# Patient Record
Sex: Female | Born: 1985 | Race: Black or African American | Hispanic: No | Marital: Single | Smoking: Never smoker
Health system: Southern US, Community
[De-identification: ages and names within clinical notes are randomized; demographics above are authoritative.]

## PROBLEM LIST (undated history)

## (undated) DIAGNOSIS — M419 Scoliosis, unspecified: Secondary | ICD-10-CM

---
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
## 2006-05-17 ENCOUNTER — Emergency Department (HOSPITAL_COMMUNITY): Admission: EM | Admit: 2006-05-17 | Discharge: 2006-05-17 | Payer: Self-pay | Admitting: Emergency Medicine

## 2008-08-03 ENCOUNTER — Emergency Department (HOSPITAL_COMMUNITY): Admission: EM | Admit: 2008-08-03 | Discharge: 2008-08-03 | Payer: Self-pay | Admitting: Emergency Medicine

## 2010-05-16 LAB — POCT URINALYSIS DIP (DEVICE)
Nitrite: NEGATIVE
Urobilinogen, UA: 1 mg/dL (ref 0.0–1.0)

## 2010-05-16 LAB — POCT PREGNANCY, URINE: Preg Test, Ur: NEGATIVE

## 2011-02-02 ENCOUNTER — Encounter (HOSPITAL_COMMUNITY): Payer: Self-pay | Admitting: *Deleted

## 2011-02-02 ENCOUNTER — Inpatient Hospital Stay (HOSPITAL_COMMUNITY)
Admission: AD | Admit: 2011-02-02 | Discharge: 2011-02-02 | Disposition: A | Discharge status: Home / Self Care | Payer: Self-pay | Source: Ambulatory Visit | Attending: Obstetrics & Gynecology | Admitting: Obstetrics & Gynecology

## 2011-02-02 DIAGNOSIS — Z0000 Encounter for general adult medical examination without abnormal findings: Secondary | ICD-10-CM

## 2011-02-02 DIAGNOSIS — N949 Unspecified condition associated with female genital organs and menstrual cycle: Secondary | ICD-10-CM | POA: Insufficient documentation

## 2011-02-02 HISTORY — DX: Scoliosis, unspecified: M41.9

## 2011-02-02 LAB — URINE MICROSCOPIC-ADD ON

## 2011-02-02 LAB — WET PREP, GENITAL
Clue Cells Wet Prep HPF POC: NONE SEEN
Trich, Wet Prep: NONE SEEN
Yeast Wet Prep HPF POC: NONE SEEN

## 2011-02-02 LAB — URINALYSIS, ROUTINE W REFLEX MICROSCOPIC
Bilirubin Urine: NEGATIVE
Nitrite: NEGATIVE
Specific Gravity, Urine: >1.03 — ABNORMAL HIGH (ref 1.005–1.030)
Urobilinogen, UA: 0.2 mg/dL (ref 0.0–1.0)
pH: 6 (ref 5.0–8.0)

## 2011-02-02 NOTE — Progress Notes (Signed)
Patient states that she has a lot of pain each month when her period comes. Her last period was on 12-10. States she is not having any pain, bleeding or unusual discharge at this time.

## 2011-02-02 NOTE — Progress Notes (Signed)
Pt states sometime she has abdominal pain,"in her bladder"Pt states some time she has pain the first couple of days during her period"

## 2011-02-02 NOTE — ED Provider Notes (Signed)
History     Chief Complaint  Patient presents with  . Pelvic Pain   HPI  Pt here with report of lower pelvic pain, intermittent, x 3 months.  No pain reported at this time.  Reports similar sensation proceeding a UTI; denies abnormal vaginal discharge or bleeding.  LMP 01/17/11.    Past Medical History  Diagnosis Date  . Scoliosis     Past Surgical History  Procedure Date  . Back surgery 2004    scoliosis    Family History  Problem Relation Age of Onset  . Kidney disease Mother   . Stroke Father   . Hypertension Father     History  Substance Use Topics  . Smoking status: Not on file  . Smokeless tobacco: Not on file  . Alcohol Use: Not on file    Allergies: No Known Allergies  Prescriptions prior to admission  Medication Sig Dispense Refill  . acetaminophen (TYLENOL) 500 MG tablet Take 500 mg by mouth every 6 (six) hours as needed. cramps       . ibuprofen (ADVIL,MOTRIN) 200 MG tablet Take 200 mg by mouth every 6 (six) hours as needed. cramps         ROS Physical Exam   Blood pressure 114/71, pulse 97, temperature 99.1 F (37.3 C), temperature source Oral, resp. rate 16, height 5\' 5"  (1.651 m), weight 55.52 kg (122 lb 6.4 oz), last menstrual period 01/16/2011, SpO2 99.00%.  Physical Exam  Constitutional: She is oriented to person, place, and time. She appears well-developed and well-nourished. No distress.  HENT:  Head: Normocephalic.  Neck: Normal range of motion. Neck supple.  Cardiovascular: Normal rate, regular rhythm and normal heart sounds.  Exam reveals no gallop and no friction rub.   No murmur heard. Respiratory: Effort normal and breath sounds normal. No respiratory distress.  GI: Soft. There is no tenderness. There is no CVA tenderness.  Genitourinary: Cervix exhibits no motion tenderness and no discharge. No bleeding around the vagina. Vaginal discharge: white dc, no odor.  Musculoskeletal: Normal range of motion.  Neurological: She is alert  and oriented to person, place, and time.  Skin: Skin is warm and dry.  Psychiatric: She has a normal mood and affect.    MAU Course  Procedures  Results for orders placed during the hospital encounter of 02/02/11 (from the past 24 hour(s))  WET PREP, GENITAL     Status: Abnormal   Collection Time   02/02/11  7:00 PM      Component Value Range   Yeast, Wet Prep NONE SEEN  NONE SEEN    Trich, Wet Prep NONE SEEN  NONE SEEN    Clue Cells, Wet Prep NONE SEEN  NONE SEEN    WBC, Wet Prep HPF POC MODERATE (*) NONE SEEN   POCT PREGNANCY, URINE     Status: Normal   Collection Time   02/02/11  7:16 PM      Component Value Range   Preg Test, Ur NEGATIVE     UA - negative  Assessment and Plan  Normal Examination  Plan: DC to home FU prn  [facility] 02/02/2011, 6:33 PM

## 2011-02-04 LAB — GC/CHLAMYDIA PROBE AMP, GENITAL: Chlamydia, DNA Probe: NEGATIVE

## 2011-02-06 NOTE — ED Provider Notes (Signed)
Agree with above note.  Donna Ward 02/06/2011 1:20 PM

## 2011-05-10 ENCOUNTER — Emergency Department (HOSPITAL_COMMUNITY)
Admission: EM | Admit: 2011-05-10 | Discharge: 2011-05-10 | Disposition: A | Discharge status: Home / Self Care | Payer: Self-pay

## 2011-06-01 ENCOUNTER — Encounter (HOSPITAL_COMMUNITY): Payer: Self-pay

## 2011-06-01 ENCOUNTER — Emergency Department (INDEPENDENT_AMBULATORY_CARE_PROVIDER_SITE_OTHER)
Admission: EM | Admit: 2011-06-01 | Discharge: 2011-06-01 | Disposition: A | Discharge status: Home / Self Care | Payer: Self-pay | Source: Home / Self Care | Attending: Emergency Medicine | Admitting: Emergency Medicine

## 2011-06-01 DIAGNOSIS — N390 Urinary tract infection, site not specified: Secondary | ICD-10-CM

## 2011-06-01 LAB — POCT URINALYSIS DIP (DEVICE)
Glucose, UA: NEGATIVE mg/dL
Ketones, ur: >=160 mg/dL — AB
Leukocytes, UA: NEGATIVE
Nitrite: NEGATIVE
pH: 6.5 (ref 5.0–8.0)

## 2011-06-01 MED ORDER — CEPHALEXIN 500 MG PO CAPS: 500.0000 mg | ORAL_CAPSULE | Freq: Four times a day (QID) | ORAL | Status: AC

## 2011-06-01 NOTE — ED Notes (Signed)
C/o suprapubic pressure/pain and urgency for 1 week. States sx much worse since yesterday.

## 2011-06-01 NOTE — Discharge Instructions (Signed)
Urinary Tract Infection A urinary tract infection (UTI) is often caused by a germ (bacteria). A UTI is usually helped with medicine (antibiotics) that kills germs. Take all the medicine until it is gone. Do this even if you are feeling better. You are usually better in 7 to 10 days. HOME CARE   Drink enough water and fluids to keep your pee (urine) clear or pale yellow. Drink:   Cranberry juice.   Water.   Avoid:   Caffeine.   Tea.   Bubbly (carbonated) drinks.   Alcohol.   Only take medicine as told by your doctor.   To prevent further infections:   Pee often.   After pooping (bowel movement), women should wipe from front to back. Use each tissue only once.   Pee before and after having sex (intercourse).  Ask your doctor when your test results will be ready. Make sure you follow up and get your test results.  GET HELP RIGHT AWAY IF:   There is very bad back pain or lower belly (abdominal) pain.   You get the chills.   You have a fever.   Your baby is older than 3 months with a rectal temperature of 102 F (38.9 C) or higher.   Your baby is 3 months old or younger with a rectal temperature of 100.4 F (38 C) or higher.   You feel sick to your stomach (nauseous) or throw up (vomit).   There is continued burning with peeing.   Your problems are not better in 3 days. Return sooner if you are getting worse.  MAKE SURE YOU:   Understand these instructions.   Will watch your condition.   Will get help right away if you are not doing well or get worse.  Document Released: 07/12/2007 Document Revised: 01/12/2011 Document Reviewed: 07/12/2007 [employer] Patient Information 2012 [employer]. 

## 2011-06-01 NOTE — ED Provider Notes (Signed)
History     CSN: [id number]  Arrival date & time 06/01/11  1020   First MD Initiated Contact with Patient 06/01/11 1048      Chief Complaint  Patient presents with  . Urinary Tract Infection    (Consider location/radiation/quality/duration/timing/severity/associated sxs/prior treatment) HPI Comments: Patient presents urgent care complaining of suprapubic pain, with urinary pressure and sense of urgency for about a week. Since yesterday felt that has been gotten progressively worse. No vomiting, no fevers no flank pain. No spontaneous abdominal pain no vaginal bleeding or discharge  Patient is a 26 y.o. female presenting with urinary tract infection. The history is provided by the patient. No language interpreter was used.  Urinary Tract Infection This is a new problem. The problem occurs constantly. The problem has not changed since onset.Pertinent negatives include no chest pain, no abdominal pain, no headaches and no shortness of breath. The symptoms are aggravated by nothing. The symptoms are relieved by nothing. She has tried nothing for the symptoms. The treatment provided no relief.    Past Medical History  Diagnosis Date  . Scoliosis     Past Surgical History  Procedure Date  . Back surgery 2004    scoliosis    Family History  Problem Relation Age of Onset  . Kidney disease Mother   . Stroke Father   . Hypertension Father     History  Substance Use Topics  . Smoking status: Never Smoker   . Smokeless tobacco: Not on file  . Alcohol Use: No    OB History    Grav Para Term Preterm Abortions TAB SAB Ect Mult Living   0               Review of Systems  Constitutional: Negative for fever, chills and activity change.  Respiratory: Negative for shortness of breath.   Cardiovascular: Negative for chest pain, palpitations and leg swelling.  Gastrointestinal: Negative for abdominal pain.  Genitourinary: Positive for dysuria and frequency. Negative for urgency,  flank pain, decreased urine volume, vaginal bleeding, vaginal discharge, enuresis, difficulty urinating and pelvic pain.  Neurological: Negative for headaches.    Allergies  Review of patient's allergies indicates no known allergies.  Home Medications   Current Outpatient Rx  Name Route Sig Dispense Refill  . CEPHALEXIN 500 MG PO CAPS Oral Take 1 capsule (500 mg total) by mouth 4 (four) times daily. 28 capsule 0  . NAPROXEN SODIUM 220 MG PO TABS Oral Take 440 mg by mouth 2 (two) times daily with a meal.      BP 125/88  Pulse 79  Temp(Src) 98.2 F (36.8 C) (Oral)  Resp 16  SpO2 99%  LMP 05/26/2011  Physical Exam  Nursing note and vitals reviewed. Constitutional: She appears well-developed and well-nourished.  Eyes: Conjunctivae are normal.  Pulmonary/Chest: Effort normal.  Abdominal: Soft. She exhibits no distension and no mass. There is no tenderness. There is no rebound, no guarding and no CVA tenderness.    ED Course  Procedures (including critical care time)  Labs Reviewed  POCT URINALYSIS DIP (DEVICE) - Abnormal; Notable for the following:    Bilirubin Urine SMALL (*)    Ketones, ur >=160 (*)    Hgb urine dipstick MODERATE (*)    Protein, ur 30 (*)    Urobilinogen, UA 2.0 (*)    All other components within normal limits  POCT PREGNANCY, URINE   No results found.   1. Urinary tract infection  MDM  The patient will uncomplicated cystitis. Afebrile tolerating oral well looks comfortable.        Jimmie Molly, MD 06/01/11 1150

## 2012-07-28 ENCOUNTER — Emergency Department (HOSPITAL_COMMUNITY)
Admission: EM | Admit: 2012-07-28 | Discharge: 2012-07-28 | Disposition: A | Discharge status: Home / Self Care | Payer: Self-pay | Attending: Emergency Medicine | Admitting: Emergency Medicine

## 2012-07-28 ENCOUNTER — Emergency Department (HOSPITAL_COMMUNITY): Payer: Self-pay

## 2012-07-28 ENCOUNTER — Encounter (HOSPITAL_COMMUNITY): Payer: Self-pay | Admitting: *Deleted

## 2012-07-28 DIAGNOSIS — Z791 Long term (current) use of non-steroidal anti-inflammatories (NSAID): Secondary | ICD-10-CM | POA: Insufficient documentation

## 2012-07-28 DIAGNOSIS — J3489 Other specified disorders of nose and nasal sinuses: Secondary | ICD-10-CM | POA: Insufficient documentation

## 2012-07-28 DIAGNOSIS — J45909 Unspecified asthma, uncomplicated: Secondary | ICD-10-CM | POA: Insufficient documentation

## 2012-07-28 DIAGNOSIS — R51 Headache: Secondary | ICD-10-CM | POA: Insufficient documentation

## 2012-07-28 DIAGNOSIS — R6883 Chills (without fever): Secondary | ICD-10-CM | POA: Insufficient documentation

## 2012-07-28 DIAGNOSIS — R5381 Other malaise: Secondary | ICD-10-CM | POA: Insufficient documentation

## 2012-07-28 DIAGNOSIS — R0989 Other specified symptoms and signs involving the circulatory and respiratory systems: Secondary | ICD-10-CM

## 2012-07-28 DIAGNOSIS — Z8739 Personal history of other diseases of the musculoskeletal system and connective tissue: Secondary | ICD-10-CM | POA: Insufficient documentation

## 2012-07-28 DIAGNOSIS — J40 Bronchitis, not specified as acute or chronic: Secondary | ICD-10-CM

## 2012-07-28 MED ORDER — ALBUTEROL SULFATE HFA 108 (90 BASE) MCG/ACT IN AERS
2.0000 | INHALATION_SPRAY | Freq: Once | RESPIRATORY_TRACT | Status: AC
  Administered 2012-07-28: 2 via RESPIRATORY_TRACT
  Filled 2012-07-28: qty 6.7

## 2012-07-28 MED ORDER — IPRATROPIUM BROMIDE 0.02 % IN SOLN
0.5000 mg | RESPIRATORY_TRACT | Status: DC
  Administered 2012-07-28: 0.5 mg via RESPIRATORY_TRACT
  Filled 2012-07-28: qty 2.5

## 2012-07-28 MED ORDER — HYDROCODONE-HOMATROPINE 5-1.5 MG/5ML PO SYRP: 5.0000 mL | ORAL_SOLUTION | Freq: Four times a day (QID) | ORAL | Status: DC | PRN

## 2012-07-28 MED ORDER — ALBUTEROL SULFATE (5 MG/ML) 0.5% IN NEBU
2.5000 mg | INHALATION_SOLUTION | RESPIRATORY_TRACT | Status: DC
  Administered 2012-07-28: 2.5 mg via RESPIRATORY_TRACT
  Filled 2012-07-28: qty 0.5

## 2012-07-28 NOTE — ED Notes (Addendum)
Pt states for 2 weeks she has had head and chest congestion and cough that is occasionally productive of green sputum.  Pt denies N/V/D and fever.

## 2012-07-28 NOTE — ED Provider Notes (Signed)
History     CSN: [id number]  Arrival date & time 07/28/12  1207   First MD Initiated Contact with Patient 07/28/12 1236      Chief Complaint  Patient presents with  . Cough  . Nasal Congestion    (Consider location/radiation/quality/duration/timing/severity/associated sxs/prior treatment) Patient is a 27 y.o. female presenting with URI. The history is provided by the patient. No language interpreter was used.  URI Presenting symptoms: congestion, cough and fatigue   Presenting symptoms: no ear pain, no facial pain, no fever, no rhinorrhea and no sore throat   Congestion:    Location:  Nasal and chest   Interferes with sleep: yes     Interferes with eating/drinking: no   Cough:    Cough characteristics:  Productive   Sputum characteristics:  Nondescript   Severity:  Moderate   Duration:  4 days   Progression:  Worsening Associated symptoms: headaches and sinus pain   Associated symptoms: no arthralgias, no myalgias, no neck pain, no sneezing, no swollen glands and no wheezing   SUBJECTIVE:  Donna Ward is a 27 y.o. female who complains of coryza, congestion, productive cough, myalgias, headache and chills for 4 days. She denies a history of anorexia, nausea, vomiting, weakness and weight loss and denies a history of asthma. Patient denies smoke cigarettes. .     Past Medical History  Diagnosis Date  . Scoliosis     Past Surgical History  Procedure Laterality Date  . Back surgery  2004    scoliosis    Family History  Problem Relation Age of Onset  . Kidney disease Mother   . Stroke Father   . Hypertension Father     History  Substance Use Topics  . Smoking status: Never Smoker   . Smokeless tobacco: Never Used  . Alcohol Use: No    OB History   Grav Para Term Preterm Abortions TAB SAB Ect Mult Living   0               Review of Systems  Constitutional: Positive for chills and fatigue. Negative for fever.  HENT: Positive for congestion.  Negative for ear pain, sore throat, rhinorrhea, sneezing, trouble swallowing and neck pain.   Respiratory: Positive for cough. Negative for shortness of breath and wheezing.   Cardiovascular: Negative for chest pain.  Gastrointestinal: Negative for nausea, vomiting, abdominal pain, diarrhea and constipation.  Genitourinary: Negative for dysuria and hematuria.  Musculoskeletal: Negative for myalgias and arthralgias.  Skin: Negative for rash.  Neurological: Positive for headaches. Negative for numbness.  All other systems reviewed and are negative.    Allergies  Review of patient's allergies indicates no known allergies.  Home Medications   Current Outpatient Rx  Name  Route  Sig  Dispense  Refill  . naproxen sodium (ANAPROX) 220 MG tablet   Oral   Take 440 mg by mouth 2 (two) times daily with a meal.           BP 115/67  Pulse 82  Temp(Src) 98.7 F (37.1 C) (Oral)  Resp 19  SpO2 97%  LMP 07/14/2012  Physical Exam Appears moderately ill but not toxic; temperature as noted in vitals. Ears normal. Eyes:glassy appearance, no discharge  Heart: RRR, NO M/G/R Throat and pharynx normal.   Neck supple. No adenopathyhy in the neck.  Sinuses non tender.  There is wheezing throughout the chest lung fields. No increased effort. Abdomen is soft and nontende  ED Course  Procedures (including critical care  time)  Labs Reviewed - No data to display No results found.   1. Bronchitis   2. Reactive airway disease that is not asthma       MDM  Given duoneb with sig relief. Pt CXR negative for acute infiltrate. Patients symptoms are consistent with URI, likely viral etiology. Discussed that antibiotics are not indicated for viral infections. Pt will be discharged with symptomatic treatment.  Verbalizes understanding and is agreeable with plan. Pt is hemodynamically stable & in NAD prior to dc. D/c with albuterol and hycodan.       Arthor Captain, PA-C 07/28/12  [address] PA-C 07/28/12 1541

## 2012-07-28 NOTE — ED Provider Notes (Signed)
Medical screening examination/treatment/procedure(s) were performed by non-physician practitioner and as supervising physician I was immediately available for consultation/collaboration.   Nelia Shi, MD 07/28/12 [phone]

## 2012-12-27 ENCOUNTER — Encounter (HOSPITAL_COMMUNITY): Payer: Self-pay | Admitting: Emergency Medicine

## 2012-12-27 ENCOUNTER — Emergency Department (HOSPITAL_COMMUNITY)
Admission: EM | Admit: 2012-12-27 | Discharge: 2012-12-27 | Disposition: A | Discharge status: Home / Self Care | Payer: Self-pay | Attending: Emergency Medicine | Admitting: Emergency Medicine

## 2012-12-27 ENCOUNTER — Emergency Department (HOSPITAL_COMMUNITY): Payer: Self-pay

## 2012-12-27 DIAGNOSIS — IMO0002 Reserved for concepts with insufficient information to code with codable children: Secondary | ICD-10-CM | POA: Insufficient documentation

## 2012-12-27 DIAGNOSIS — Y9389 Activity, other specified: Secondary | ICD-10-CM | POA: Insufficient documentation

## 2012-12-27 DIAGNOSIS — M549 Dorsalgia, unspecified: Secondary | ICD-10-CM

## 2012-12-27 DIAGNOSIS — Z8739 Personal history of other diseases of the musculoskeletal system and connective tissue: Secondary | ICD-10-CM | POA: Insufficient documentation

## 2012-12-27 DIAGNOSIS — Y92410 Unspecified street and highway as the place of occurrence of the external cause: Secondary | ICD-10-CM | POA: Insufficient documentation

## 2012-12-27 MED ORDER — HYDROCODONE-ACETAMINOPHEN 5-325 MG PO TABS: 1.0000 | ORAL_TABLET | Freq: Four times a day (QID) | ORAL | Status: AC | PRN

## 2012-12-27 MED ORDER — IBUPROFEN 800 MG PO TABS
800.0000 mg | ORAL_TABLET | Freq: Once | ORAL | Status: AC
  Administered 2012-12-27: 800 mg via ORAL
  Filled 2012-12-27: qty 1
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
## 2012-12-27 NOTE — ED Notes (Signed)
PA in room

## 2012-12-27 NOTE — ED Notes (Signed)
Vital signs stable. 

## 2012-12-27 NOTE — ED Notes (Signed)
Patient transported to X-ray 
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
## 2012-12-28 NOTE — ED Provider Notes (Signed)
Medical screening examination/treatment/procedure(s) were performed by non-physician practitioner and as supervising physician I was immediately available for consultation/collaboration.  EKG Interpretation   None        Margurette Brener K Ameka Krigbaum-Rasch, MD 12/28/12 1610

## 2014-01-27 ENCOUNTER — Emergency Department (HOSPITAL_COMMUNITY): Payer: Self-pay

## 2014-01-27 ENCOUNTER — Encounter (HOSPITAL_COMMUNITY): Payer: Self-pay | Admitting: Emergency Medicine

## 2014-01-27 ENCOUNTER — Emergency Department (HOSPITAL_COMMUNITY)
Admission: EM | Admit: 2014-01-27 | Discharge: 2014-01-27 | Disposition: A | Discharge status: Home / Self Care | Payer: Self-pay | Attending: Emergency Medicine | Admitting: Emergency Medicine

## 2014-01-27 DIAGNOSIS — R1031 Right lower quadrant pain: Secondary | ICD-10-CM

## 2014-01-27 DIAGNOSIS — R102 Pelvic and perineal pain: Secondary | ICD-10-CM | POA: Insufficient documentation

## 2014-01-27 DIAGNOSIS — Z3202 Encounter for pregnancy test, result negative: Secondary | ICD-10-CM | POA: Insufficient documentation

## 2014-01-27 DIAGNOSIS — R1021 Pelvic and perineal pain right side: Secondary | ICD-10-CM

## 2014-01-27 DIAGNOSIS — N898 Other specified noninflammatory disorders of vagina: Secondary | ICD-10-CM | POA: Insufficient documentation

## 2014-01-27 DIAGNOSIS — R300 Dysuria: Secondary | ICD-10-CM

## 2014-01-27 DIAGNOSIS — M545 Low back pain: Secondary | ICD-10-CM | POA: Insufficient documentation

## 2014-01-27 LAB — WET PREP, GENITAL
Trich, Wet Prep: NONE SEEN
Yeast Wet Prep HPF POC: NONE SEEN

## 2014-01-27 LAB — BASIC METABOLIC PANEL
Anion gap: 6 (ref 5–15)
BUN: 10 mg/dL (ref 6–23)
CO2: 28 mmol/L (ref 19–32)
Calcium: 9 mg/dL (ref 8.4–10.5)
Chloride: 102 mEq/L (ref 96–112)
Creatinine, Ser: 0.66 mg/dL (ref 0.50–1.10)
GFR calc Af Amer: >90 mL/min (ref >90)
GFR calc non Af Amer: >90 mL/min (ref >90)
Glucose, Bld: 97 mg/dL (ref 70–99)
Potassium: 3.6 mmol/L (ref 3.5–5.1)
Sodium: 136 mmol/L (ref 135–145)

## 2014-01-27 LAB — URINALYSIS, ROUTINE W REFLEX MICROSCOPIC
Bilirubin Urine: NEGATIVE
GLUCOSE, UA: NEGATIVE mg/dL
KETONES UR: NEGATIVE mg/dL
Nitrite: NEGATIVE
PROTEIN: NEGATIVE mg/dL
Specific Gravity, Urine: 1.03 (ref 1.005–1.030)
Urobilinogen, UA: 0.2 mg/dL (ref 0.0–1.0)
pH: 6 (ref 5.0–8.0)

## 2014-01-27 LAB — CBC WITH DIFFERENTIAL/PLATELET
Basophils Absolute: 0 10*3/uL (ref 0.0–0.1)
Basophils Relative: 1 % (ref 0–1)
Eosinophils Absolute: 0.1 10*3/uL (ref 0.0–0.7)
Eosinophils Relative: 2 % (ref 0–5)
HCT: 38.2 % (ref 36.0–46.0)
Hemoglobin: 12.6 g/dL (ref 12.0–15.0)
Lymphocytes Relative: 39 % (ref 12–46)
Lymphs Abs: 2.3 10*3/uL (ref 0.7–4.0)
MCH: 30.8 pg (ref 26.0–34.0)
MCHC: 33 g/dL (ref 30.0–36.0)
MCV: 93.4 fL (ref 78.0–100.0)
Monocytes Absolute: 0.4 10*3/uL (ref 0.1–1.0)
Monocytes Relative: 7 % (ref 3–12)
Neutro Abs: 3 10*3/uL (ref 1.7–7.7)
Neutrophils Relative %: 51 % (ref 43–77)
Platelets: 241 10*3/uL (ref 150–400)
RBC: 4.09 MIL/uL (ref 3.87–5.11)
RDW: 12.7 % (ref 11.5–15.5)
WBC: 5.8 10*3/uL (ref 4.0–10.5)

## 2014-01-27 LAB — URINE MICROSCOPIC-ADD ON

## 2014-01-27 LAB — POC URINE PREG, ED: Preg Test, Ur: NEGATIVE

## 2014-01-27 MED ORDER — PHENAZOPYRIDINE HCL 200 MG PO TABS: 200.0000 mg | ORAL_TABLET | Freq: Three times a day (TID) | ORAL | Status: DC

## 2014-01-27 MED ORDER — NITROFURANTOIN MONOHYD MACRO 100 MG PO CAPS: 100.0000 mg | ORAL_CAPSULE | Freq: Two times a day (BID) | ORAL | Status: AC

## 2014-01-27 MED ORDER — TRAMADOL HCL 50 MG PO TABS: 50.0000 mg | ORAL_TABLET | Freq: Four times a day (QID) | ORAL | Status: AC | PRN

## 2014-01-27 MED ORDER — MORPHINE SULFATE 4 MG/ML IJ SOLN
4.0000 mg | Freq: Once | INTRAMUSCULAR | Status: AC
  Administered 2014-01-27: 4 mg via INTRAVENOUS
  Filled 2014-01-27: qty 1

## 2014-01-27 MED ORDER — IBUPROFEN 600 MG PO TABS: 600.0000 mg | ORAL_TABLET | Freq: Four times a day (QID) | ORAL | Status: AC | PRN

## 2014-01-27 NOTE — ED Notes (Signed)
Pt states that she has had low abd pain and dysuria since "before breakfast" this morning.  Denies other sx.

## 2014-01-27 NOTE — ED Provider Notes (Signed)
CSN: [id number]     Arrival date & time 01/27/14  5621 History   First MD Initiated Contact with Patient 01/27/14 [phone]     Chief Complaint  Patient presents with  . Dysuria  . Abdominal Pain     (Consider location/radiation/quality/duration/timing/severity/associated sxs/prior Treatment) HPI  Pt is a 28yo female presenting to ED with c/o RLQ abdominal pain that started this morning before breakfast, associated with dysuria, and lower back pain. Pain is constant, sharp and stabbing, worse with certain movements and walking, 6/10 at worst. Denies fever, chills, n/v/d. States pain is similar to cramping but she finished her menstrual cycle just last week and states it is never usually on her right side. She has had urinary infections in past. Denies vaginal symptoms. No medication taken PTA. Denies hx of abdominal surgeries. Denies hx of renal stones.  LMP: 01/22/14, states she has been having her cycle twice a month, heavy periods and irregular.  Denies concern for STD. She has not f/u with GYN. Pt is not on birth control.  Past Medical History  Diagnosis Date  . Scoliosis   . Scoliosis    Past Surgical History  Procedure Laterality Date  . Back surgery  2004    scoliosis   Family History  Problem Relation Age of Onset  . Kidney disease Mother   . Stroke Father   . Hypertension Father    History  Substance Use Topics  . Smoking status: Never Smoker   . Smokeless tobacco: Never Used  . Alcohol Use: No   OB History    Gravida Para Term Preterm AB TAB SAB Ectopic Multiple Living   0              Review of Systems  Constitutional: Negative for fever, chills and appetite change.  Respiratory: Negative for cough and shortness of breath.   Gastrointestinal: Positive for abdominal pain. Negative for nausea, vomiting, diarrhea and constipation.  Genitourinary: Positive for dysuria. Negative for urgency, frequency, hematuria, flank pain, decreased urine volume, vaginal bleeding,  vaginal discharge, vaginal pain, menstrual problem and pelvic pain.  Musculoskeletal: Positive for back pain ( lower). Negative for myalgias.  All other systems reviewed and are negative.     Allergies  Review of patient's allergies indicates no known allergies.  Home Medications   Prior to Admission medications   Medication Sig Start Date End Date Taking? Authorizing Provider  HYDROcodone-acetaminophen (NORCO/VICODIN) 5-325 MG per tablet Take 1-2 tablets by mouth every 6 (six) hours as needed for severe pain. Patient not taking: Reported on 01/27/2014 12/27/12   Victorino Dike L Piepenbrink, PA-C  ibuprofen (ADVIL,MOTRIN) 600 MG tablet Take 1 tablet (600 mg total) by mouth every 6 (six) hours as needed. 01/27/14   Junius Finner, PA-C  nitrofurantoin, macrocrystal-monohydrate, (MACROBID) 100 MG capsule Take 1 capsule (100 mg total) by mouth 2 (two) times daily. 01/27/14   Junius Finner, PA-C  phenazopyridine (PYRIDIUM) 200 MG tablet Take 1 tablet (200 mg total) by mouth 3 (three) times daily. 01/27/14   Junius Finner, PA-C  traMADol (ULTRAM) 50 MG tablet Take 1 tablet (50 mg total) by mouth every 6 (six) hours as needed. 01/27/14   Junius Finner, PA-C   BP 120/71 mmHg  Pulse 66  Temp(Src) 98.3 F (36.8 C) (Oral)  Resp 16  SpO2 100% Physical Exam  Constitutional: She appears well-developed and well-nourished. No distress.  Pt lying comfortably in exam bed, NAD.   HENT:  Head: Normocephalic and atraumatic.  Eyes: Conjunctivae are normal.  No scleral icterus.  Neck: Normal range of motion.  Cardiovascular: Normal rate, regular rhythm and normal heart sounds.   Pulmonary/Chest: Effort normal and breath sounds normal. No respiratory distress. She has no wheezes. She has no rales. She exhibits no tenderness.  Abdominal: Soft. Bowel sounds are normal. She exhibits no distension and no mass. There is tenderness. There is guarding. There is no rebound.  Soft, non-distended, tenderness in RLQ, no  rebound, guarding or masses. No CVAT  Genitourinary:  Chaperoned exam. Normal external genitalia, small amount of physiologic vaginal discharge. No CMT, Right adnexal tenderness w/o mass. No left adnexal tenderness or mass.    Musculoskeletal: Normal range of motion.  Neurological: She is alert.  Skin: Skin is warm and dry. She is not diaphoretic.  Nursing note and vitals reviewed.   ED Course  Procedures (including critical care time) Labs Review Labs Reviewed  WET PREP, GENITAL - Abnormal; Notable for the following:    Clue Cells Wet Prep HPF POC FEW (*)    WBC, Wet Prep HPF POC MANY (*)    All other components within normal limits  URINALYSIS, ROUTINE W REFLEX MICROSCOPIC - Abnormal; Notable for the following:    Color, Urine AMBER (*)    Hgb urine dipstick TRACE (*)    Leukocytes, UA TRACE (*)    All other components within normal limits  URINE MICROSCOPIC-ADD ON - Abnormal; Notable for the following:    Squamous Epithelial / LPF FEW (*)    All other components within normal limits  URINE CULTURE  CBC WITH DIFFERENTIAL  BASIC METABOLIC PANEL  POC URINE PREG, ED    Imaging Review [place] Transvaginal Non-ob  01/27/2014   CLINICAL DATA:  Right-sided pelvic pain since this morning ; urinary tract infections symptoms  EXAM: TRANSABDOMINAL AND TRANSVAGINAL ULTRASOUND OF PELVIS  DOPPLER ULTRASOUND OF OVARIES  TECHNIQUE: Both transabdominal and transvaginal ultrasound examinations of the pelvis were performed. Transabdominal technique was performed for global imaging of the pelvis including uterus, ovaries, adnexal regions, and pelvic cul-de-sac.  It was necessary to proceed with endovaginal exam following the transabdominal exam to visualize the endometrium and adnexal structures. Color and duplex Doppler ultrasound was utilized to evaluate blood flow to the ovaries.  COMPARISON:  None.  FINDINGS: Uterus  Measurements: 8.1 x 2.8 x 4.0 cm. No fibroids or other mass visualized.  Endometrium   Thickness: 7.4 mm. There is a heterogeneous focus in the endometrium in the lower uterine segment.  Right ovary  Measurements: 3.0 x 1.5 x 2.0 cm. Normal appearance/no adnexal mass.  Left ovary  Measurements: 2.5 x 1.2 x 1.7 cm. Normal appearance/no adnexal mass.  Pulsed Doppler evaluation of both ovaries demonstrates normal low-resistance arterial and venous waveforms.  Other findings  No free fluid.  IMPRESSION: 1. No adnexal mass is demonstrated. Vascularity of the ovaries is normal. 2. The uterus exhibits normal echotexture and contour. The endometrial stripe is not abnormally thickened. There is a heterogeneous focus in the inferior aspect of the endometrium that is nonspecific. Follow-up ultrasound in 4-6 weeks is recommended to reassess the endometrium.   Electronically Signed   By: David  Swazil[place]   On: 01/27/2014 13:15   [place] Pelvis Complete  01/27/2014   CLINICAL DATA:  Right pelvic pain.  EXAM: TRANSABDOMINAL AND TRANSVAGINAL ULTRASOUND OF PELVIS  DOPPLER ULTRASOUND OF OVARIES  TECHNIQUE: Both transabdominal and transvaginal ultrasound examinations of the pelvis were performed. Transabdominal technique was performed for global imaging of the pelvis including uterus, ovaries, adnexal  regions, and pelvic cul-de-sac.  It was necessary to proceed with endovaginal exam following the transabdominal exam to visualize the uterus and ovaries. Color and duplex Doppler ultrasound was utilized to evaluate blood flow to the ovaries.  COMPARISON:  None.  FINDINGS: Uterus  Measurements: 8.1 x 2.8 x 4.0 cm. No fibroids or other mass visualized. Small amount of fluid in the endocervix.  Endometrium  Thickness: 7.4 mm.  No focal abnormality visualized.  Right ovary  Measurements: 3.0 x 1.5 x 2.0 cm. Normal appearance/no adnexal mass.  Left ovary  Measurements: 2.5 x 1.2 x 1.7 cm. Normal appearance/no adnexal mass.  Pulsed Doppler evaluation of both ovaries demonstrates normal low-resistance arterial and venous waveforms.   Other findings  Trace free fluid  IMPRESSION: Negative for mass.  Negative for ovarian torsion.  Small amount of fluid in the endocervix.   Electronically Signed   By: Marlan Palauharles  Clark M.D.   On: 01/27/2014 13:17   [place] Art/ven Flow Abd Pelv Doppler  01/27/2014   CLINICAL DATA:  Right pelvic pain.  EXAM: TRANSABDOMINAL AND TRANSVAGINAL ULTRASOUND OF PELVIS  DOPPLER ULTRASOUND OF OVARIES  TECHNIQUE: Both transabdominal and transvaginal ultrasound examinations of the pelvis were performed. Transabdominal technique was performed for global imaging of the pelvis including uterus, ovaries, adnexal regions, and pelvic cul-de-sac.  It was necessary to proceed with endovaginal exam following the transabdominal exam to visualize the uterus and ovaries. Color and duplex Doppler ultrasound was utilized to evaluate blood flow to the ovaries.  COMPARISON:  None.  FINDINGS: Uterus  Measurements: 8.1 x 2.8 x 4.0 cm. No fibroids or other mass visualized. Small amount of fluid in the endocervix.  Endometrium  Thickness: 7.4 mm.  No focal abnormality visualized.  Right ovary  Measurements: 3.0 x 1.5 x 2.0 cm. Normal appearance/no adnexal mass.  Left ovary  Measurements: 2.5 x 1.2 x 1.7 cm. Normal appearance/no adnexal mass.  Pulsed Doppler evaluation of both ovaries demonstrates normal low-resistance arterial and venous waveforms.  Other findings  Trace free fluid  IMPRESSION: Negative for mass.  Negative for ovarian torsion.  Small amount of fluid in the endocervix.   Electronically Signed   By: Marlan Palauharles  Clark M.D.   On: 01/27/2014 13:17     EKG Interpretation None      MDM   Final diagnoses:  RLQ abdominal pain  Right adnexal tenderness  Dysuria    Pt is a 28yo female c/o RLQ abdominal pain, onset this morning before breakfast, associated dysuria and low back pain. No hx of abdominal surgeries. No n/v/d. On exam, pt appears well, non-toxic, no CVAT.  Pt is tender in RLQ w/o rebound, guarding or masses.  DDx: ectopic  pregnancy, appendicitis, TAO,  Ovarian torsion, ovarian cyst or fibroid, UTI, kidney stone.  Pt is afebrile. Urine preg: negative UA: trace leukocytes, trace Hgb- urine culture sent CBC and BMP: unremarkable.  Pelvic exam: significant for Right adnexal tenderness, will get pelvic U/S to r/o TAO and ovarian torsion. Wet prep: unremarkable.  Pelvic U/S: no adnexal mass demonstrated. Vascularity of ovaries is normal.  No evidence of ovarian abscess or TAO.  No fibroids or cysts.  Dicussed pt with Dr. Elesa MassedWard, will have pt f/u for RLQ pain as pt is afebrile, no leukocytosis. Will start pt on macrobid for possible early UTI. Also advised pt to f/u with GYN for further evaluation of U/S findings which showed a heterogeneous focus in the inferior aspect of the endometrium that is nonspecific.  Recommend f/u U/S in  4-6 weeks. Pt is hemodynamically stable, pain has improved to 0/10. Will discharge home. Return precautions provided. Pt verbalized understanding and agreement with tx plan.    Junius Finner, PA-C 01/27/14 1451  Layla Maw Ward, DO 01/27/14 1530

## 2014-01-27 NOTE — ED Notes (Signed)
Patient transported to Ultrasound 

## 2014-01-28 LAB — URINE CULTURE
Colony Count: NO GROWTH
Culture: NO GROWTH

## 2014-03-27 ENCOUNTER — Encounter (HOSPITAL_COMMUNITY): Payer: Self-pay

## 2014-03-27 ENCOUNTER — Emergency Department (HOSPITAL_COMMUNITY)
Admission: EM | Admit: 2014-03-27 | Discharge: 2014-03-27 | Disposition: A | Discharge status: Home / Self Care | Payer: 59 | Source: Home / Self Care | Attending: Emergency Medicine | Admitting: Emergency Medicine

## 2014-03-27 DIAGNOSIS — N390 Urinary tract infection, site not specified: Secondary | ICD-10-CM

## 2014-03-27 LAB — POCT URINALYSIS DIP (DEVICE)
Glucose, UA: NEGATIVE mg/dL
KETONES UR: NEGATIVE mg/dL
NITRITE: NEGATIVE
PH: 7 (ref 5.0–8.0)
Protein, ur: NEGATIVE mg/dL
Specific Gravity, Urine: 1.025 (ref 1.005–1.030)
UROBILINOGEN UA: 2 mg/dL — AB (ref 0.0–1.0)

## 2014-03-27 LAB — POCT PREGNANCY, URINE: PREG TEST UR: NEGATIVE

## 2014-03-27 MED ORDER — CEPHALEXIN 500 MG PO CAPS: 500.0000 mg | ORAL_CAPSULE | Freq: Three times a day (TID) | ORAL | Status: AC

## 2014-03-27 MED ORDER — PHENAZOPYRIDINE HCL 200 MG PO TABS: 200.0000 mg | ORAL_TABLET | Freq: Three times a day (TID) | ORAL | Status: AC

## 2014-03-27 NOTE — ED Provider Notes (Signed)
CSN: [id number]     Arrival date & time 03/27/14  0909 History   First MD Initiated Contact with Patient 03/27/14 [phone]     Chief Complaint  Patient presents with  . Urinary Tract Infection   (Consider location/radiation/quality/duration/timing/severity/associated sxs/prior Treatment) HPI Comments: 24-48 hours of urinary frequency, urgency and dysuria without associated hematuria. Does mention that she is currently finishing menstrual cycle and is still spotting.   Patient is a 29 y.o. female presenting with urinary tract infection. The history is provided by the patient.  Urinary Tract Infection This is a new problem. The current episode started 2 days ago. The problem occurs constantly. The problem has been gradually worsening.    Past Medical History  Diagnosis Date  . Scoliosis   . Scoliosis    Past Surgical History  Procedure Laterality Date  . Back surgery  2004    scoliosis   Family History  Problem Relation Age of Onset  . Kidney disease Mother   . Stroke Father   . Hypertension Father    History  Substance Use Topics  . Smoking status: Never Smoker   . Smokeless tobacco: Never Used  . Alcohol Use: No   OB History    Gravida Para Term Preterm AB TAB SAB Ectopic Multiple Living   0              Review of Systems  Constitutional: Negative.   Respiratory: Negative.   Cardiovascular: Negative.   Gastrointestinal: Negative.   Genitourinary: Positive for dysuria, urgency and frequency. Negative for hematuria, flank pain, vaginal bleeding, vaginal discharge, difficulty urinating, genital sores, vaginal pain, menstrual problem and pelvic pain.  Musculoskeletal: Negative for back pain.  Skin: Negative.     Allergies  Review of patient's allergies indicates no known allergies.  Home Medications   Prior to Admission medications   Medication Sig Start Date End Date Taking? Authorizing Provider  cephALEXin (KEFLEX) 500 MG capsule Take 1 capsule (500 mg total) by  mouth 3 (three) times daily. 03/27/14   Ria ClockJennifer Lee H Chanteria Haggard, PA  HYDROcodone-acetaminophen (NORCO/VICODIN) 5-325 MG per tablet Take 1-2 tablets by mouth every 6 (six) hours as needed for severe pain. Patient not taking: Reported on 01/27/2014 12/27/12   Victorino DikeJennifer L Piepenbrink, PA-C  ibuprofen (ADVIL,MOTRIN) 600 MG tablet Take 1 tablet (600 mg total) by mouth every 6 (six) hours as needed. 01/27/14   Junius FinnerErin O'Malley, PA-C  nitrofurantoin, macrocrystal-monohydrate, (MACROBID) 100 MG capsule Take 1 capsule (100 mg total) by mouth 2 (two) times daily. 01/27/14   Junius FinnerErin O'Malley, PA-C  phenazopyridine (PYRIDIUM) 200 MG tablet Take 1 tablet (200 mg total) by mouth 3 (three) times daily. 03/27/14   Mathis FareJennifer Lee H Delrae Hagey, PA  traMADol (ULTRAM) 50 MG tablet Take 1 tablet (50 mg total) by mouth every 6 (six) hours as needed. 01/27/14   Junius FinnerErin O'Malley, PA-C   BP 99/65 mmHg  Pulse 67  Temp(Src) 98.2 F (36.8 C) (Oral)  Resp 16  SpO2 97%  LMP 03/22/2014 (Exact Date) Physical Exam  Constitutional: She is oriented to person, place, and time. She appears well-developed and well-nourished. No distress.  HENT:  Head: Normocephalic and atraumatic.  Eyes: Conjunctivae are normal.  Cardiovascular: Normal rate, regular rhythm and normal heart sounds.   Pulmonary/Chest: Effort normal and breath sounds normal.  Abdominal: Soft. Normal appearance and bowel sounds are normal. There is no tenderness. There is no CVA tenderness.  Musculoskeletal: Normal range of motion.  Neurological: She is alert and oriented to  person, place, and time.  Skin: Skin is warm and dry.  Psychiatric: She has a normal mood and affect. Her behavior is normal.  Nursing note and vitals reviewed.   ED Course  Procedures (including critical care time) Labs Review Labs Reviewed  POCT URINALYSIS DIP (DEVICE) - Abnormal; Notable for the following:    Bilirubin Urine SMALL (*)    Hgb urine dipstick MODERATE (*)    Urobilinogen, UA 2.0 (*)     Leukocytes, UA TRACE (*)    All other components within normal limits  URINE CULTURE  POCT PREGNANCY, URINE    Imaging Review No results found.   MDM   1. UTI (lower urinary tract infection)    UA only notable for moderate Hg, likely from current menses, and trace LE. Will send specimen for C&S. Given current symptoms, will provide RX for pyridium and cephalexin and advise follow up if no improvement  Ria Clock, PA 03/27/14 1030

## 2014-03-27 NOTE — ED Notes (Signed)
C/o history of frequent UTI. This episode began this AM , not better w cranberry juice . States she has not been sexually active in a while , and is not concerned about STD; does not feel like yeast per pt

## 2014-03-27 NOTE — Discharge Instructions (Signed)

## 2014-03-27 NOTE — ED Notes (Signed)
Call back number for labs verified 

## 2014-03-28 LAB — URINE CULTURE
COLONY COUNT: NO GROWTH
CULTURE: NO GROWTH
Special Requests: NORMAL
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
## 2015-04-01 ENCOUNTER — Encounter (HOSPITAL_COMMUNITY): Payer: Self-pay | Admitting: Emergency Medicine

## 2015-04-01 ENCOUNTER — Emergency Department (HOSPITAL_COMMUNITY)
Admission: EM | Admit: 2015-04-01 | Discharge: 2015-04-01 | Disposition: A | Discharge status: Home / Self Care | Payer: BLUE CROSS/BLUE SHIELD | Attending: Emergency Medicine | Admitting: Emergency Medicine

## 2015-04-01 DIAGNOSIS — J111 Influenza due to unidentified influenza virus with other respiratory manifestations: Secondary | ICD-10-CM | POA: Insufficient documentation

## 2015-04-01 DIAGNOSIS — Z792 Long term (current) use of antibiotics: Secondary | ICD-10-CM | POA: Insufficient documentation

## 2015-04-01 DIAGNOSIS — Z8739 Personal history of other diseases of the musculoskeletal system and connective tissue: Secondary | ICD-10-CM | POA: Insufficient documentation

## 2015-04-01 DIAGNOSIS — Z79899 Other long term (current) drug therapy: Secondary | ICD-10-CM | POA: Insufficient documentation

## 2015-04-01 DIAGNOSIS — R509 Fever, unspecified: Secondary | ICD-10-CM | POA: Diagnosis present

## 2015-04-01 MED ORDER — OSELTAMIVIR PHOSPHATE 75 MG PO CAPS: 75.0000 mg | ORAL_CAPSULE | Freq: Two times a day (BID) | ORAL | Status: AC

## 2015-04-01 NOTE — ED Provider Notes (Signed)
CSN: [id number]     Arrival date & time 04/01/15  4098 History   First MD Initiated Contact with Patient 04/01/15 [phone]     No chief complaint on file.    (Consider location/radiation/quality/duration/timing/severity/associated sxs/prior Treatment) HPI   Irva Loser is a 30 y.o. female ill with fever, chills, myalgia, cough, rhinorrhea, for 1 day. She is using Tylenol for fever. Cough is nonproductive. No nausea, vomiting, weakness or dizziness. She works at a nursing home.   Past Medical History  Diagnosis Date  . Scoliosis   . Scoliosis    Past Surgical History  Procedure Laterality Date  . Back surgery  2004    scoliosis   Family History  Problem Relation Age of Onset  . Kidney disease Mother   . Stroke Father   . Hypertension Father    Social History  Substance Use Topics  . Smoking status: Never Smoker   . Smokeless tobacco: Never Used  . Alcohol Use: No   OB History    Gravida Para Term Preterm AB TAB SAB Ectopic Multiple Living   0              Review of Systems  All other systems reviewed and are negative.     Allergies  Review of patient's allergies indicates no known allergies.  Home Medications   Prior to Admission medications   Medication Sig Start Date End Date Taking? Authorizing Provider  cephALEXin (KEFLEX) 500 MG capsule Take 1 capsule (500 mg total) by mouth 3 (three) times daily. 03/27/14   Ria Clock, PA  HYDROcodone-acetaminophen (NORCO/VICODIN) 5-325 MG per tablet Take 1-2 tablets by mouth every 6 (six) hours as needed for severe pain. Patient not taking: Reported on 01/27/2014 12/27/12   Francee Piccolo, PA-C  ibuprofen (ADVIL,MOTRIN) 600 MG tablet Take 1 tablet (600 mg total) by mouth every 6 (six) hours as needed. 01/27/14   Junius Finner, PA-C  nitrofurantoin, macrocrystal-monohydrate, (MACROBID) 100 MG capsule Take 1 capsule (100 mg total) by mouth 2 (two) times daily. 01/27/14   Junius Finner, PA-C   phenazopyridine (PYRIDIUM) 200 MG tablet Take 1 tablet (200 mg total) by mouth 3 (three) times daily. 03/27/14   Mathis Fare Presson, PA  traMADol (ULTRAM) 50 MG tablet Take 1 tablet (50 mg total) by mouth every 6 (six) hours as needed. 01/27/14   Junius Finner, PA-C   BP 119/74 mmHg  Pulse 80  Temp(Src) 99.8 F (37.7 C) (Oral)  Resp 16  SpO2 100% Physical Exam  Constitutional: She is oriented to person, place, and time. She appears well-developed and well-nourished. No distress.  HENT:  Head: Normocephalic and atraumatic.  Right Ear: External ear normal.  Left Ear: External ear normal.  Mild clear nasal discharge  Eyes: Conjunctivae and EOM are normal. Pupils are equal, round, and reactive to light.  Neck: Normal range of motion and phonation normal. Neck supple.  Cardiovascular: Normal rate, regular rhythm and normal heart sounds.   Pulmonary/Chest: Effort normal and breath sounds normal. She exhibits no bony tenderness.  Abdominal: Soft. There is no tenderness.  Musculoskeletal: Normal range of motion.  Neurological: She is alert and oriented to person, place, and time. No cranial nerve deficit or sensory deficit. She exhibits normal muscle tone. Coordination normal.  Skin: Skin is warm, dry and intact.  Psychiatric: She has a normal mood and affect. Her behavior is normal. Judgment and thought content normal.  Nursing note and vitals reviewed.   ED Course  Procedures (  including critical care time)  Medications - No data to display  Patient Vitals for the past 24 hrs:  BP Temp Temp src Pulse Resp SpO2  04/01/15 0837 119/74 mmHg 99.8 F (37.7 C) Oral 80 16 100 %    10:00 AM Reevaluation with update and discussion. After initial assessment and treatment, an updated evaluation reveals no change in clinical status. Findings discussed with patient, all questions answered. Holton Sidman L    Labs Review Labs Reviewed - No data to display  Imaging Review No results  found. I have personally reviewed and evaluated these images and lab results as part of my medical decision-making.   EKG Interpretation None      MDM   Final diagnoses:  Influenza    Evaluation consistent with seasonal influenza. Doubt SBI, metabolic instability or impending vascular  collapse   Nursing Notes Reviewed/ Care Coordinated Applicable Imaging Reviewed Interpretation of Laboratory Data incorporated into ED treatment  The patient appears reasonably screened and/or stabilized for discharge and I doubt any other medical condition or other [facility] requiring further screening, evaluation, or treatment in the ED at this time prior to discharge.  Plan: Home Medications- Tamiflu; Home Treatments- rest, fluids; return here if the recommended treatment, does not improve the symptoms; Recommended follow up- PCP, when necessary     Mancel Bale, MD 04/01/15 1003

## 2015-04-01 NOTE — Discharge Instructions (Signed)
°  Use Tylenol every 4 hours for fever Use Robitussin-DM for cough Do not return to work until your fever has been gone for 24 hours, without antipyretics   Influenza, Adult Influenza ("the flu") is a viral infection of the respiratory tract. It occurs more often in winter months because people spend more time in close contact with one another. Influenza can make you feel very sick. Influenza easily spreads from person to person (contagious). CAUSES  Influenza is caused by a virus that infects the respiratory tract. You can catch the virus by breathing in droplets from an infected person's cough or sneeze. You can also catch the virus by touching something that was recently contaminated with the virus and then touching your mouth, nose, or eyes. RISKS AND COMPLICATIONS You may be at risk for a more severe case of influenza if you smoke cigarettes, have diabetes, have chronic heart disease (such as heart failure) or lung disease (such as asthma), or if you have a weakened immune system. Elderly people and pregnant women are also at risk for more serious infections. The most common problem of influenza is a lung infection (pneumonia). Sometimes, this problem can require emergency medical care and may be life threatening. SIGNS AND SYMPTOMS  Symptoms typically last 4 to 10 days and may include:  Fever.  Chills.  Headache, body aches, and muscle aches.  Sore throat.  Chest discomfort and cough.  Poor appetite.  Weakness or feeling tired.  Dizziness.  Nausea or vomiting. DIAGNOSIS  Diagnosis of influenza is often made based on your history and a physical exam. A nose or throat swab test can be done to confirm the diagnosis. TREATMENT  In mild cases, influenza goes away on its own. Treatment is directed at relieving symptoms. For more severe cases, your health care provider may prescribe antiviral medicines to shorten the sickness. Antibiotic medicines are not effective because the  infection is caused by a virus, not by bacteria. HOME CARE INSTRUCTIONS  Take medicines only as directed by your health care provider.  Use a cool mist humidifier to make breathing easier.  Get plenty of rest until your temperature returns to normal. This usually takes 3 to 4 days.  Drink enough fluid to keep your urine clear or pale yellow.  Cover yourmouth and nosewhen coughing or sneezing,and wash your handswellto prevent thevirusfrom spreading.  Stay homefromwork orschool untilthe fever is gonefor at least 29full day. PREVENTION  An annual influenza vaccination (flu shot) is the best way to avoid getting influenza. An annual flu shot is now routinely recommended for all adults in the [place] SEEK MEDICAL CARE IF:  You experiencechest pain, yourcough worsens,or you producemore mucus.  Youhave nausea,vomiting, ordiarrhea.  Your fever returns or gets worse. SEEK IMMEDIATE MEDICAL CARE IF:  You havetrouble breathing, you become short of breath,or your skin ornails becomebluish.  You have severe painor stiffnessin the neck.  You develop a sudden headache, or pain in the face or ear.  You have nausea or vomiting that you cannot control. MAKE SURE YOU:   Understand these instructions.  Will watch your condition.  Will get help right away if you are not doing well or get worse.   This information is not intended to replace advice given to you by your health care provider. Make sure you discuss any questions you have with your health care provider.   Document Released: 01/21/2000 Document Revised: 02/13/2014 Document Reviewed: 04/24/2011 [employer] Interactive Patient Education [employer]

## 2015-04-01 NOTE — ED Notes (Signed)
Pt c/o generalized body aches, headache, eye pain, cough. Pt states she had temperature of 101.8 F at home, took tylenol this morning.
